# Patient Record
Sex: Male | Born: 1995 | Race: White | Hispanic: No | Marital: Single
Health system: Midwestern US, Community
[De-identification: ages and names within clinical notes are randomized; demographics above are authoritative.]

## PROBLEM LIST (undated history)

## (undated) HISTORY — PX: TONSILLECTOMY: SUR1361

## (undated) HISTORY — PX: KNEE ARTHROSCOPY: SHX127

---
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
## 2017-09-09 IMAGING — DX DG TIBIA/FIBULA PORT 2V*R*
4 series · 4 of 4 positions shown · non-contrast
Comparison: Outside radiographs in PACs 08/06/2015

CLINICAL DATA: Post ORIF RIGHT tibia

EXAM:
PORTABLE RIGHT TIBIA AND FIBULA - 2 VIEW

[tibia ap (1 of 2)]
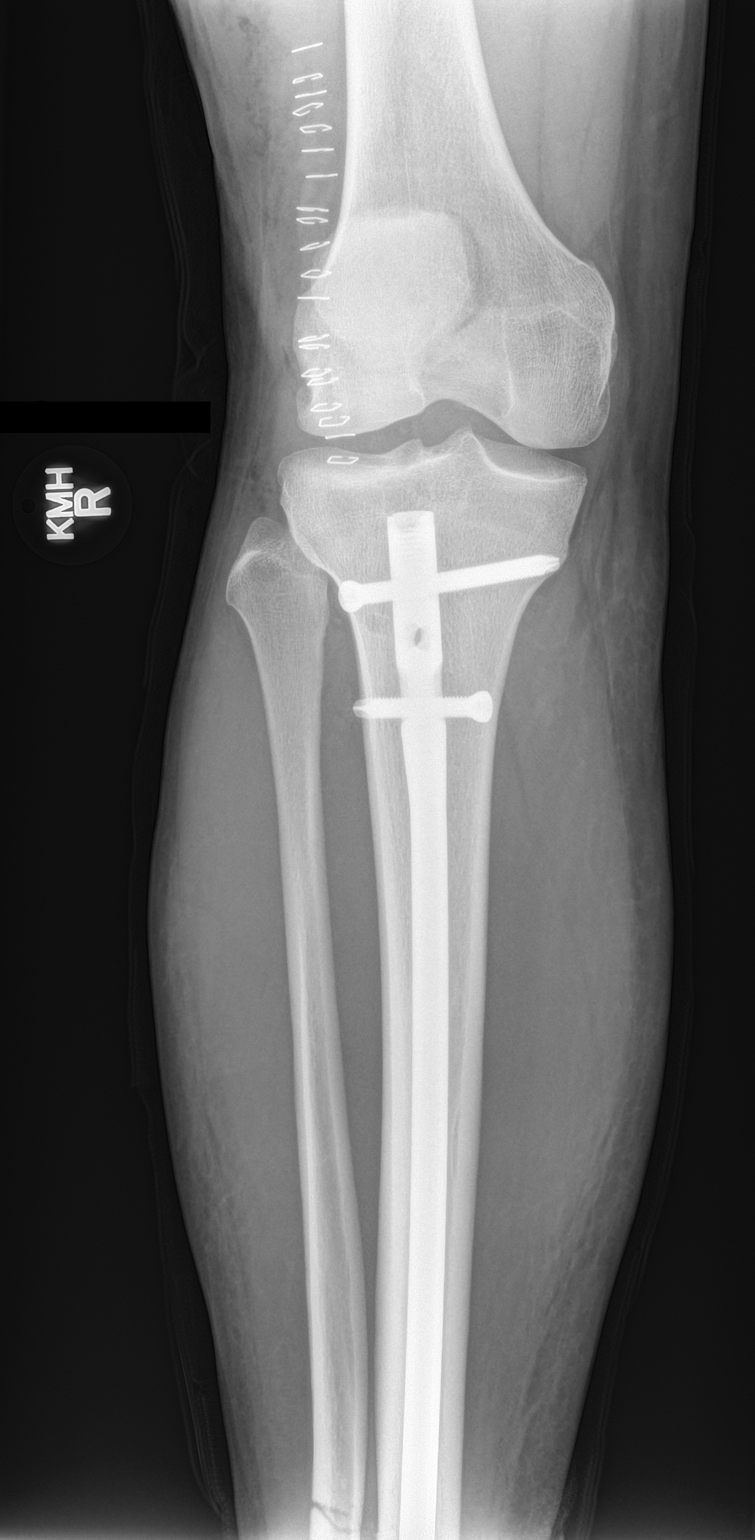

[tibia lat (1 of 2)]
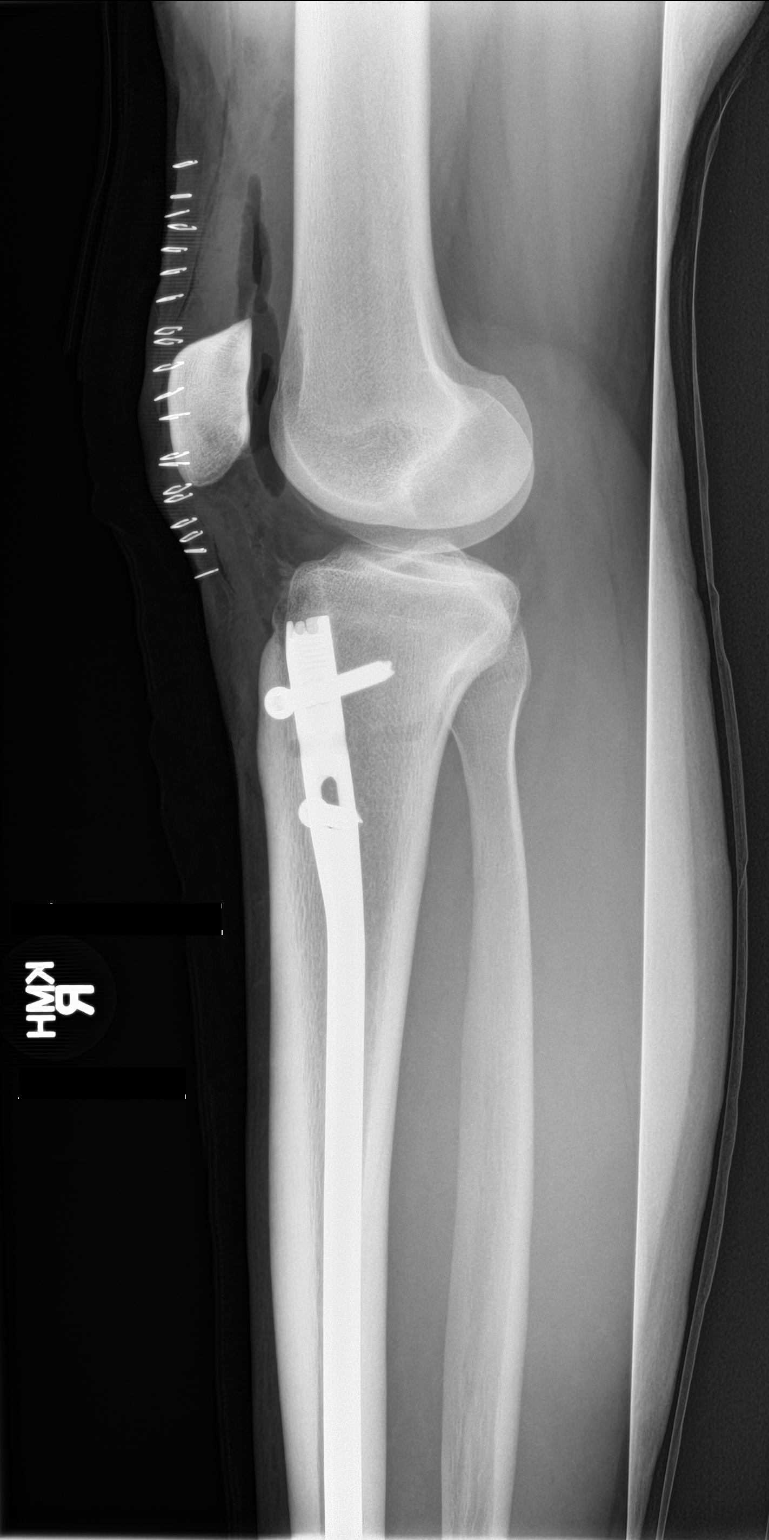

[tibia ap (2 of 2)]
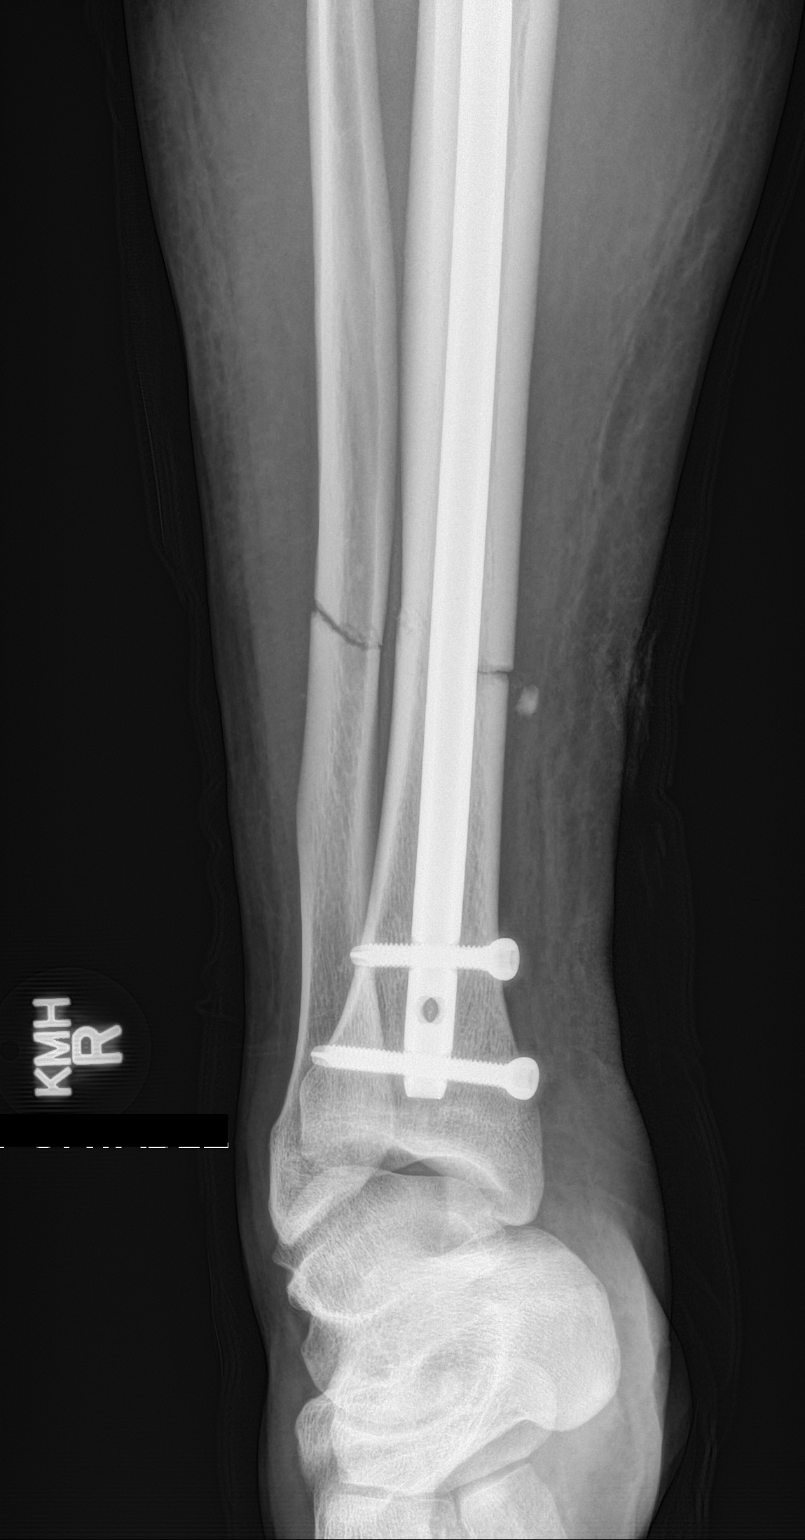

[tibia lat (2 of 2)]
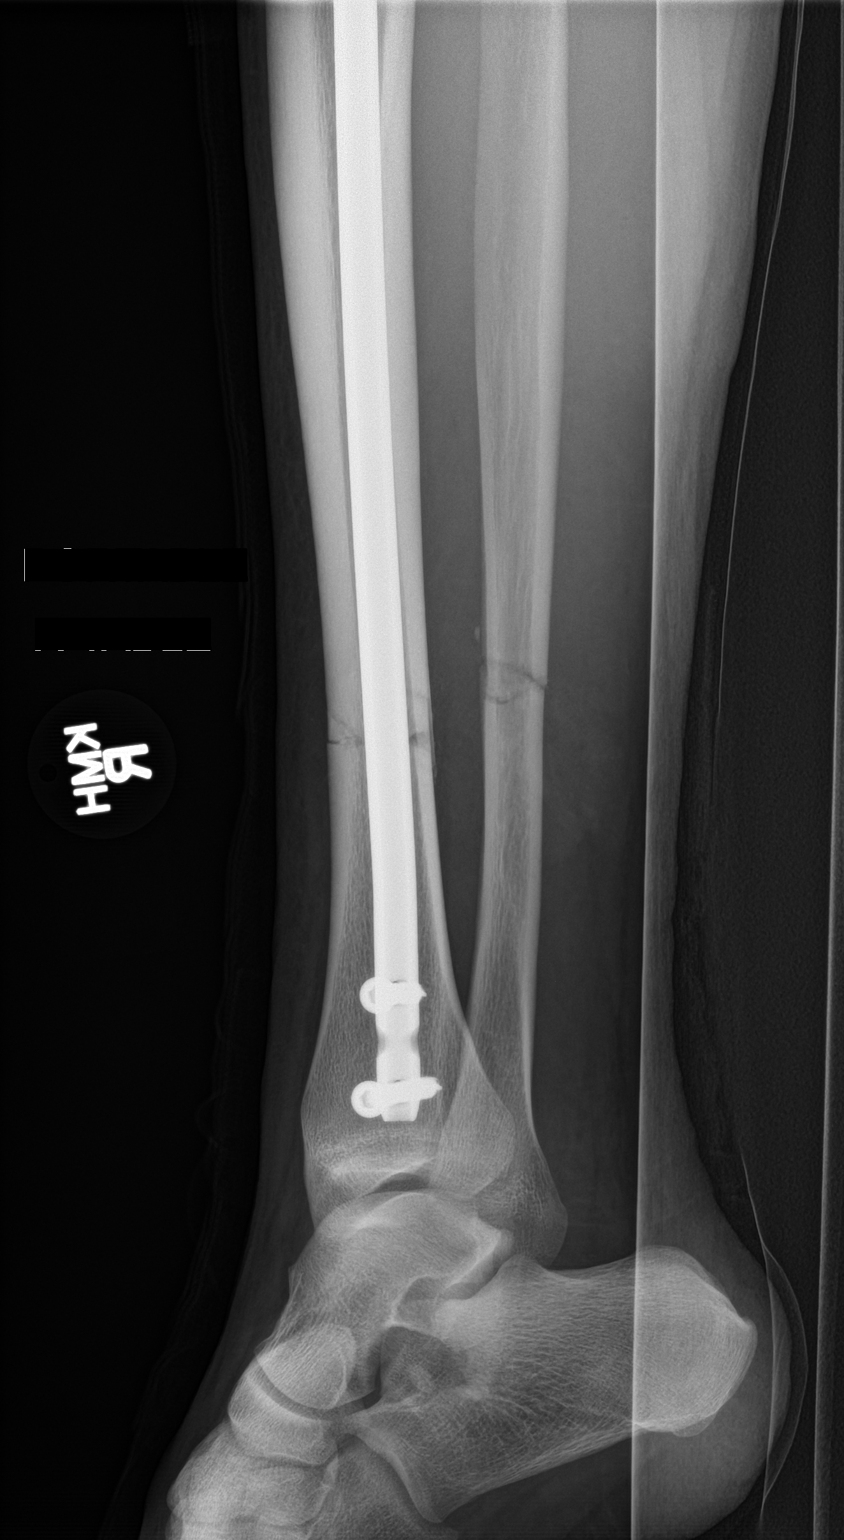

[4 of 4 positions shown; findings below may reference images not displayed]

FINDINGS: IM nail with proximal and distal screws identified in RIGHT tibia
post nailing of a reduced distal diaphyseal fracture.

Nondisplaced oblique distal fibular diaphyseal fracture.

Knee and ankle joint alignments normal.

No additional fracture, dislocation or bone destruction identified.

Air within the joint from preceding surgery.
IMPRESSION: Post nailing of a reduced distal RIGHT tibial diaphyseal fracture.

Nondisplaced oblique distal RIGHT fibular diaphyseal fracture.
